# Patient Record
Sex: Male | Born: 1995 | Race: Black or African American | Hispanic: No | Marital: Single | Smoking: Never smoker
Health system: Southern US, Community
[De-identification: ages and names within clinical notes are randomized; demographics above are authoritative.]

## PROBLEM LIST (undated history)

## (undated) HISTORY — PX: OTHER SURGICAL HISTORY: SHX169

---

## 2017-10-08 ENCOUNTER — Encounter (HOSPITAL_COMMUNITY): Payer: Self-pay

## 2017-10-08 ENCOUNTER — Other Ambulatory Visit: Payer: Self-pay

## 2017-10-08 ENCOUNTER — Emergency Department (HOSPITAL_COMMUNITY): Payer: BLUE CROSS/BLUE SHIELD

## 2017-10-08 ENCOUNTER — Emergency Department (HOSPITAL_COMMUNITY)
Admission: EM | Admit: 2017-10-08 | Discharge: 2017-10-08 | Disposition: A | Discharge status: Home / Self Care | Payer: BLUE CROSS/BLUE SHIELD | Attending: Emergency Medicine | Admitting: Emergency Medicine

## 2017-10-08 DIAGNOSIS — K5900 Constipation, unspecified: Secondary | ICD-10-CM | POA: Insufficient documentation

## 2017-10-08 DIAGNOSIS — Z79899 Other long term (current) drug therapy: Secondary | ICD-10-CM | POA: Insufficient documentation

## 2017-10-08 DIAGNOSIS — N342 Other urethritis: Secondary | ICD-10-CM | POA: Insufficient documentation

## 2017-10-08 DIAGNOSIS — R1030 Lower abdominal pain, unspecified: Secondary | ICD-10-CM | POA: Diagnosis present

## 2017-10-08 DIAGNOSIS — N3001 Acute cystitis with hematuria: Secondary | ICD-10-CM | POA: Diagnosis not present

## 2017-10-08 LAB — COMPREHENSIVE METABOLIC PANEL
ALBUMIN: 3.8 g/dL (ref 3.5–5.0)
ALT: 17 U/L (ref 0–44)
ANION GAP: 8 (ref 5–15)
AST: 18 U/L (ref 15–41)
Alkaline Phosphatase: 70 U/L (ref 38–126)
BUN: 15 mg/dL (ref 6–20)
CHLORIDE: 101 mmol/L (ref 98–111)
CO2: 30 mmol/L (ref 22–32)
Calcium: 9.1 mg/dL (ref 8.9–10.3)
Creatinine, Ser: 1.14 mg/dL (ref 0.61–1.24)
GFR calc Af Amer: >60 mL/min (ref >60)
GFR calc non Af Amer: >60 mL/min (ref >60)
GLUCOSE: 81 mg/dL (ref 70–99)
POTASSIUM: 3.7 mmol/L (ref 3.5–5.1)
SODIUM: 139 mmol/L (ref 135–145)
Total Bilirubin: 0.9 mg/dL (ref 0.3–1.2)
Total Protein: 7.6 g/dL (ref 6.5–8.1)

## 2017-10-08 LAB — URINALYSIS, ROUTINE W REFLEX MICROSCOPIC
BACTERIA UA: NONE SEEN
Bilirubin Urine: NEGATIVE
GLUCOSE, UA: NEGATIVE mg/dL
KETONES UR: NEGATIVE mg/dL
Nitrite: NEGATIVE
PROTEIN: 100 mg/dL — AB
RBC / HPF: >50 RBC/hpf — ABNORMAL HIGH (ref 0–5)
Specific Gravity, Urine: 1.021 (ref 1.005–1.030)
pH: 6 (ref 5.0–8.0)

## 2017-10-08 LAB — CBC
HEMATOCRIT: 44 % (ref 39.0–52.0)
HEMOGLOBIN: 15.7 g/dL (ref 13.0–17.0)
MCH: 31.2 pg (ref 26.0–34.0)
MCHC: 35.7 g/dL (ref 30.0–36.0)
MCV: 87.5 fL (ref 78.0–100.0)
Platelets: 210 10*3/uL (ref 150–400)
RBC: 5.03 MIL/uL (ref 4.22–5.81)
RDW: 13 % (ref 11.5–15.5)
WBC: 7.7 10*3/uL (ref 4.0–10.5)

## 2017-10-08 LAB — LIPASE, BLOOD: LIPASE: 21 U/L (ref 11–51)

## 2017-10-08 MED ORDER — AZITHROMYCIN 250 MG PO TABS
1000.0000 mg | ORAL_TABLET | Freq: Once | ORAL | Status: AC
  Administered 2017-10-08: 1000 mg via ORAL
  Filled 2017-10-08: qty 4

## 2017-10-08 MED ORDER — LIDOCAINE HCL 2 % IJ SOLN
INTRAMUSCULAR | Status: AC
  Administered 2017-10-08: 400 mg
  Filled 2017-10-08: qty 20

## 2017-10-08 MED ORDER — CEFTRIAXONE SODIUM 250 MG IJ SOLR
250.0000 mg | Freq: Once | INTRAMUSCULAR | Status: AC
  Administered 2017-10-08: 250 mg via INTRAMUSCULAR
  Filled 2017-10-08: qty 250

## 2017-10-08 MED ORDER — CEPHALEXIN 500 MG PO CAPS: 500.0000 mg | ORAL_CAPSULE | Freq: Four times a day (QID) | ORAL | 0 refills | Status: AC

## 2017-10-08 NOTE — ED Triage Notes (Signed)
Patient c/o generalized abdominal pain and constipation. Patient reports a small BM yesterday,but has been 3-4 days for a normal BM. Patient denies any N/V. Patient took OTC stool softeners x 2 tabs 2 days ago.

## 2017-10-08 NOTE — ED Provider Notes (Signed)
[facility]-EMERGENCY DEPT Provider Note   CSN: [id number] Arrival date & time: 10/08/17  1125     History   Chief Complaint Chief Complaint  Patient presents with  . Abdominal Pain  . Constipation    HPI James Bailey is a 22 y.o. male.  The history is provided by the patient.  Abdominal Pain   This is a new problem. The current episode started 2 days ago. The problem occurs constantly. The problem has been gradually worsening. Associated with: worse with urination. The pain is located in the suprapubic region. The quality of the pain is cramping, colicky and aching. The pain is at a severity of 6/10. The pain is moderate. Associated symptoms include anorexia, constipation, dysuria, frequency and hematuria. Pertinent negatives include fever, nausea and vomiting. Associated symptoms comments: Currently sexually active with 1 partner and does not use protection.  Pt's partner is also having some burning and dysuria.  Pt denies any penile discharge and no CVA tenderness. The symptoms are aggravated by urination. Nothing relieves the symptoms.  Constipation   Associated symptoms include abdominal pain and dysuria.    History reviewed. No pertinent past medical history.  There are no active problems to display for this patient.   Past Surgical History:  Procedure Laterality Date  . surgery of the penis          Home Medications    Prior to Admission medications   Medication Sig Start Date End Date Taking? Authorizing Provider  ibuprofen (ADVIL,MOTRIN) 200 MG tablet Take 200-400 mg by mouth daily as needed (pain).   Yes [provider]  Multiple Vitamin (MULTIVITAMIN WITH MINERALS) TABS tablet Take 1 tablet by mouth daily.   Yes [provider]  naproxen sodium (ALEVE) 220 MG tablet Take 220-440 mg by mouth daily as needed (pain).   Yes [provider]  valACYclovir (VALTREX) 500 MG tablet Take 500 mg by mouth daily.   Yes  [provider]    Family History History reviewed. No pertinent family history.  Social History Social History   Tobacco Use  . Smoking status: Never Smoker  . Smokeless tobacco: Never Used  Substance Use Topics  . Alcohol use: Never    Frequency: Never  . Drug use: Never     Allergies   Patient has no known allergies.   Review of Systems Review of Systems  Constitutional: Negative for fever.  Gastrointestinal: Positive for abdominal pain, anorexia and constipation. Negative for nausea and vomiting.  Genitourinary: Positive for dysuria, frequency and hematuria.  All other systems reviewed and are negative.    Physical Exam Updated Vital Signs BP 126/64 (BP Location: Left Arm)   Pulse (!) 56   Temp 98.6 F (37 C) (Oral)   Resp 14   Ht 5' 7.5" (1.715 m)   Wt 66.2 kg   SpO2 100%   BMI 22.53 kg/m   Physical Exam  Constitutional: He is oriented to person, place, and time. He appears well-developed and well-nourished. No distress.  HENT:  Head: Normocephalic and atraumatic.  Mouth/Throat: Oropharynx is clear and moist.  Eyes: Pupils are equal, round, and reactive to light. Conjunctivae and EOM are normal.  Neck: Normal range of motion. Neck supple.  Cardiovascular: Normal rate, regular rhythm and intact distal pulses.  No murmur heard. Pulmonary/Chest: Effort normal and breath sounds normal. No respiratory distress. He has no wheezes. He has no rales.  Abdominal: Soft. He exhibits no distension. There is tenderness in the suprapubic  area. There is no rebound, no guarding and no CVA tenderness.  Musculoskeletal: Normal range of motion. He exhibits no edema or tenderness.  Neurological: He is alert and oriented to person, place, and time.  Skin: Skin is warm and dry. No rash noted. No erythema.  Psychiatric: He has a normal mood and affect. His behavior is normal.  Nursing note and vitals reviewed.    ED Treatments / Results  Labs (all labs ordered  are listed, but only abnormal results are displayed) Labs Reviewed  URINALYSIS, ROUTINE W REFLEX MICROSCOPIC - Abnormal; Notable for the following components:      Result Value   APPearance HAZY (*)    Hgb urine dipstick LARGE (*)    Protein, ur 100 (*)    Leukocytes, UA MODERATE (*)    RBC / HPF >50 (*)    WBC, UA >50 (*)    Non Squamous Epithelial 0-5 (*)    All other components within normal limits  LIPASE, BLOOD  COMPREHENSIVE METABOLIC PANEL  CBC  GC/CHLAMYDIA PROBE AMP ([place]) NOT AT [facility]    EKG None  Radiology Dg Abdomen 1 View  Result Date: 10/08/2017 CLINICAL DATA:  Lower abdominal pain, constipation EXAM: ABDOMEN - 1 VIEW COMPARISON:  None. FINDINGS: Nonobstructive bowel gas pattern. Normal colonic stool burden. Visualized osseous structures are within normal limits. IMPRESSION: Negative. Electronically Signed   By: Charline BillsSriyesh  Krishnan M.D.   On: 10/08/2017 12:25    Procedures Procedures (including critical care time)  Medications Ordered in ED Medications  cefTRIAXone (ROCEPHIN) injection 250 mg (has no administration in time range)  azithromycin (ZITHROMAX) tablet 1,000 mg (has no administration in time range)     Initial Impression / Assessment and Plan / ED Course  I have reviewed the triage vital signs and the nursing notes.  Pertinent labs & imaging results that were available during my care of the patient were reviewed by me and considered in my medical decision making (see chart for details).     Patient presenting with suprapubic pain, dysuria and hematuria for the last 2 days.  Patient states he has not had a bowel movement in several days and feels like he is constipated.  He did have a small bowel movement yesterday after taking stool softeners but states his abdominal pain has not improved.  He denies any penile discharge but patient's partner who he has unprotected sex with is also having some burning and dysuria.  Patient states he is only  sexually active with one partner at this time.  However UA is consistent with UTI versus urethritis.  Patient's labs CBC, CMP and lipase are all within normal limits.  He has no flank pain or concern for kidney stone at this time.  Will treat with Rocephin and azithromycin for STD but will continue Keflex in case patient has a UTI.  GC chlamydia were sent from the urine.  Recommended that patient's partner be tested and treated if necessary.  Final Clinical Impressions(s) / ED Diagnoses   Final diagnoses:  Acute cystitis with hematuria  Urethritis    ED Discharge Orders         Ordered    cephALEXin (KEFLEX) 500 MG capsule  4 times daily     10/08/17 1411           Gwyneth SproutPlunkett, Shaeley Segall, MD 10/08/17 1434

## 2017-10-08 NOTE — ED Notes (Signed)
Patient ambulatory to XR

## 2017-10-08 NOTE — ED Notes (Signed)
ED Provider at bedside. 

## 2017-10-09 LAB — GC/CHLAMYDIA PROBE AMP (~~LOC~~) NOT AT ARMC
Chlamydia: POSITIVE — AB
NEISSERIA GONORRHEA: NEGATIVE

## 2020-03-20 IMAGING — CR DG ABDOMEN 1V
1 series · 1 of 1 positions shown · non-contrast
Comparison: None.

CLINICAL DATA: Lower abdominal pain, constipation

EXAM:
ABDOMEN - 1 VIEW

[t abdomen supine]
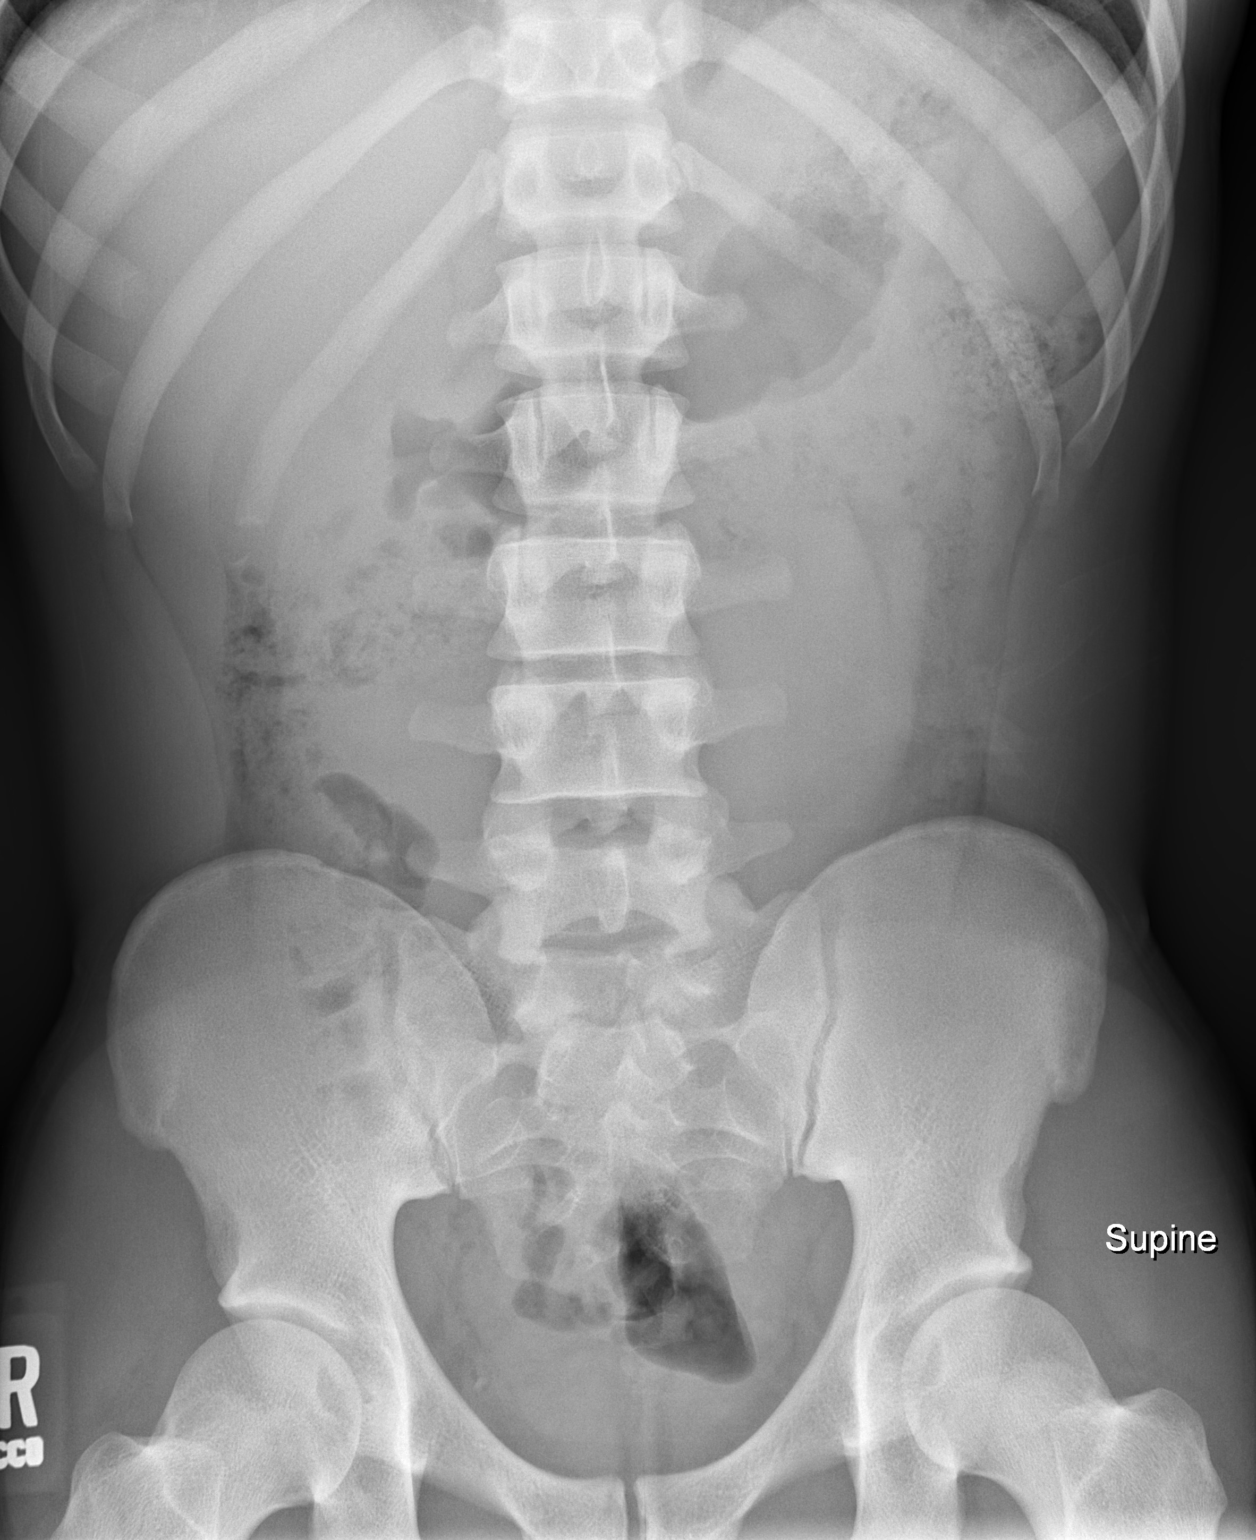

[1 of 1 positions shown; findings below may reference images not displayed]

FINDINGS: Nonobstructive bowel gas pattern.

Normal colonic stool burden.

Visualized osseous structures are within normal limits.
IMPRESSION: Negative.
